# Patient Record
Sex: Female | Born: 1990 | Race: White | Hispanic: No | Marital: Single | Smoking: Never smoker
Health system: Southern US, Community
[De-identification: ages and names within clinical notes are randomized; demographics above are authoritative.]

## PROBLEM LIST (undated history)

## (undated) DIAGNOSIS — R109 Unspecified abdominal pain: Secondary | ICD-10-CM

## (undated) DIAGNOSIS — F430 Acute stress reaction: Secondary | ICD-10-CM

## (undated) DIAGNOSIS — F909 Attention-deficit hyperactivity disorder, unspecified type: Secondary | ICD-10-CM

## (undated) DIAGNOSIS — G4700 Insomnia, unspecified: Secondary | ICD-10-CM

## (undated) DIAGNOSIS — R4681 Obsessive-compulsive behavior: Secondary | ICD-10-CM

## (undated) DIAGNOSIS — K5090 Crohn's disease, unspecified, without complications: Secondary | ICD-10-CM

## (undated) DIAGNOSIS — E282 Polycystic ovarian syndrome: Secondary | ICD-10-CM

## (undated) DIAGNOSIS — F411 Generalized anxiety disorder: Secondary | ICD-10-CM

## (undated) HISTORY — DX: Unspecified abdominal pain: R10.9

## (undated) HISTORY — DX: Generalized anxiety disorder: F41.1

## (undated) HISTORY — DX: Acute stress reaction: F43.0

## (undated) HISTORY — DX: Obsessive-compulsive behavior: R46.81

## (undated) HISTORY — DX: Polycystic ovarian syndrome: E28.2

## (undated) HISTORY — DX: Crohn's disease, unspecified, without complications: K50.90

## (undated) HISTORY — DX: Insomnia, unspecified: G47.00

## (undated) HISTORY — DX: Attention-deficit hyperactivity disorder, unspecified type: F90.9

---

## 2011-10-12 ENCOUNTER — Ambulatory Visit (INDEPENDENT_AMBULATORY_CARE_PROVIDER_SITE_OTHER): Payer: BC Managed Care – PPO | Admitting: Psychiatry

## 2011-10-12 ENCOUNTER — Encounter (HOSPITAL_COMMUNITY): Payer: Self-pay | Admitting: Psychiatry

## 2011-10-12 VITALS — BP 122/79 | HR 79 | Ht 66.0 in | Wt 126.0 lb

## 2011-10-12 DIAGNOSIS — R4681 Obsessive-compulsive behavior: Secondary | ICD-10-CM

## 2011-10-12 DIAGNOSIS — F429 Obsessive-compulsive disorder, unspecified: Secondary | ICD-10-CM

## 2011-10-12 DIAGNOSIS — F900 Attention-deficit hyperactivity disorder, predominantly inattentive type: Secondary | ICD-10-CM | POA: Insufficient documentation

## 2011-10-12 DIAGNOSIS — F419 Anxiety disorder, unspecified: Secondary | ICD-10-CM

## 2011-10-12 DIAGNOSIS — F411 Generalized anxiety disorder: Secondary | ICD-10-CM

## 2011-10-12 DIAGNOSIS — G4700 Insomnia, unspecified: Secondary | ICD-10-CM | POA: Insufficient documentation

## 2011-10-12 DIAGNOSIS — F988 Other specified behavioral and emotional disorders with onset usually occurring in childhood and adolescence: Secondary | ICD-10-CM

## 2011-10-12 MED ORDER — ZOLPIDEM TARTRATE 10 MG PO TABS: 10.0000 mg | ORAL_TABLET | Freq: Every evening | ORAL | Status: AC | PRN

## 2011-10-12 MED ORDER — LISDEXAMFETAMINE DIMESYLATE 20 MG PO CAPS: 20.0000 mg | ORAL_CAPSULE | ORAL | Status: AC

## 2011-10-12 NOTE — Progress Notes (Signed)
Cassandra Daugherty is 20 y.o. 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  She Transferred from [employer].  Her Classes are going well She was taking Adderall ~ 5 months ago. She stopped taking it when she needed to be 'creative' and insurance was inactive when needed Rx.Her parents divorced at age 20 and mother remarried  Her father is a Regulatory affairs officer.   She is an only child  She has step siblings 2 on father's side and 3 on mother's side. Her step fathe Cassandra Daugherty and mother divoced 2 yrs ago when he disclosed he was gay.  He and mother still work at [employer]  Her mother's job changed and pt had to find her own place.  Her BF Cassandra Daugherty [played football] now is a Holiday representative at [employer].  Maj: general studies.  They live together.  She has a part time job at a Recruitment consultant.  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  At night she has pain and racing thoughts.  She smokes cannabis [BF does also - has an immunodeficiency disease] to go to sleep  She is always playing with her hair  And peels nails polish off her nails. She has Chron's Disease, PCOS  She has facial hair that makes her self-conscious   It also causes acne.  She doesn't like her boyfriend to touch her face and she uses lots of makeup.  She has financial worries, goes to school and work afterwards.   She is calm, very articulate.  She denies Suicidal ideation, When she takes Adderall, she may get irritable and at times has to walk out of the classroom to avoid hitting someone for making stupid remarks.  She denies auditory hallucinations, no visual hallucinations.  She is cognitively intact, Her insight is good and Judgement is good.  There is no legal involvement.  She feels like her mother and step father depend upon her good behavior because they have so much care to give to her step brother, Cassandra Daugherty, who has schizophrenia; has ADHD.  He is very intelligent top 1% [place].   Her mother and step father spend so much attention to her care that she feels like they take her for granted.  She says she has a pattern of having everything right, has to be 'just so'.  She says her mother has the same behaviors.  She describes this as her anxiety, especially when going to sleep She would like to resume taking her adderall - later stated as Vyvanse.   She agrees also to a trial period of taking ONLY  Zolpidem for sleep to test its efficacy.   She is receptive to the idea of therapy to gain insight and  control her obsessive thinking.  She does not describe any compulsive behaviors and does not report disruption of a responsible routine.

## 2011-10-12 NOTE — Patient Instructions (Addendum)
Take Rxs as directed.  Do not use amitriptyline with ambien. [zolpidem]  Call with any adverse effects.  Crisis hot line card is given Make appt  With KL Return in 1 month.

## 2011-10-15 ENCOUNTER — Telehealth (HOSPITAL_COMMUNITY): Payer: Self-pay

## 2011-10-15 NOTE — Telephone Encounter (Signed)
Pharmacy requests Prior Authorization for Vyvanse and for zolpidem

## 2011-10-15 NOTE — Telephone Encounter (Signed)
[facility] [phone]  says they need verification that you wrote the scripts please call pharmacy

## 2011-10-15 NOTE — Telephone Encounter (Signed)
Called [facility] to verify PA is neded for Vyvanse and for zolpidem.  Called [facility] and provided Id and pt data.  PA for Vyvanse is given Sep 23, 2011 --Oct 14, 2012  # [id number]  And PA for zopidem 10 mg  Sep 24, 2011 - Oct 14, 2012  #[id number]

## 2011-11-06 ENCOUNTER — Ambulatory Visit (INDEPENDENT_AMBULATORY_CARE_PROVIDER_SITE_OTHER): Payer: BC Managed Care – PPO | Admitting: Psychology

## 2011-11-06 ENCOUNTER — Encounter (HOSPITAL_COMMUNITY): Payer: Self-pay | Admitting: Psychology

## 2011-11-06 DIAGNOSIS — R4681 Obsessive-compulsive behavior: Secondary | ICD-10-CM

## 2011-11-06 DIAGNOSIS — F429 Obsessive-compulsive disorder, unspecified: Secondary | ICD-10-CM

## 2011-11-06 NOTE — Progress Notes (Signed)
Presenting Problem Chief Complaint: Too much on my plate at once; I got to school all day and go to work straight at night.  She feels like she's progressing but then doesn't feel that way. 'I don't have any time for myself'.  What are the main stressors in your life right now? Depression  1, Anxiety   3, Mood Swings  2, Sleep Changes   3, Work Problems   2, Racing Thoughts   3, Confusion   1, Memory Problems   2, Loss of Interest   3, Irritability   2, Excessive Worrying   2, Low Energy   1, Obsessive Thoughts   3, Checking   2, Poor Concentration   3 and Hyperactivity   1  How long have you had these symptoms?: Mother and stepfather who was real father figure divorced three years ago after he disclosed that he was gay.   Previous mental health services Have you ever been treated for a mental health problem? Yes  If Yes, when? 11th grade (about six months and patient didn't really notice a change , where? [place], by whom? Doesn't recall name of provider   Are you currently seeing a therapist or counselor? No  Have you ever had a mental health hospitalization? No  Have you ever been treated with medication for a mental health problem? Yes If Yes, please list as completely as possible (name of medication, reason prescribed, and response: Prozac, and various ADHD meds  Have you ever had suicidal thoughts or attempted suicide? No  Risk factors for Suicide Demographic factors:  Adolescent or young adult, Caucasian and Low socioeconomic status Current mental status:  Loss factors: Loss of significant relationship and Financial problems/change in socioeconomic status- had to move away from mom when she got a ob [facility] after divorcing her husband; her mother decided the patient needed to do things on her own and her mother moved to [place] Historical factors: Family history of mental illness or substance abuse Risk Reduction factors: Employed, Living with another person,  especially a relative, Positive social support and Positive coping skills or problem solving skills Clinical factors:  Depression:   Anhedonia Insomnia Recent sense of peace/wellbeing Cognitive features that contribute to risk:  SUICIDE RISK:  Minimal: No identifiable suicidal ideation.  Patients presenting with no risk factors but with morbid ruminations; may be classified as minimal risk based on the severity of the depressive symptoms   Medical history Medical treatment and/or problems: Yes If Yes, please explain; Crohn's disease, PCOD  Name of primary care physician/last physical exam: Linn Doyne Keel with [facility] in [place]; Dr. Tresa Endo at [facility]  Chronic pain issues: Yes If Yes, please explain: stomach and knees (sports activities)  Allergies: No   Current medications: See medication history  Is there any history of mental health problems or substance abuse in your family? Yes If Yes, please explain (include information on parents, siblings, aunts/uncles, grandparents, cousins, etc.): Mom has depression and has been on Prozac and the patient has increased concern for alcoholism.  Her dad had a history of depression and has been on Prozac.  Her maternal grandparents and her paternal grandfather were alcoholic.  Has anyone in your family been hospitalized for mental health problems? No   Social/family history Who lives in your current household? She lives in [place] in an apartment with her 90 year old boyfriend [place] and her cat Georgeann Oppenheim, and her puppy Thamas Jaegers.  She feels stressed by the size of the apartment (2  br but 2nd bedroom is laundry room) as well and is obsessive in her need to keep the apartment clean.  Military history: Have you ever been in the [employer]? No  Religious/spiritual involvement:  What Religion are you? Agnostic  Family of origin (childhood history)  Where were you born? [facility] Where did you grow up?  [place] until 6; [place] adolescence until age 60. Describe the household where you grew up: With her father and mother; her father would be lazy and her grandmother would take care of her because he didn't want to take care of her.  Her stepfather was always there for her and always attended her events when he wasn't working. Do you have siblings, step/half siblings? Yes If Yes, please list names, sex and ages: two stepbrothers and one stepsister with stepfather's; her stepmother has two daughters.  She got along much better with her stepfather's children than her stemother's children.  She rpeorts her stepmother's children were very entitled, made good grades and always excelled in sports and she felt she had to live up to those expectations.  The patient is the youngest of all of the children.  Are your parents separated/divorced? Yes If Yes, approximately when? Patient was 5 years old  Are you presently: living with boyfriend of 11 months.  Social supports (personal and professional): her mom  Education How many grades have you completed? some college Do you hold any Degrees? Yes If Yes, in what?  will graduate with a degree in May 2013- graphics arts and Youth worker  From where? [facility] What were your special talents/interests in school? Art, very creative  Did you have any problems in school? Yes If Yes, were these problems behavioral, attention, or due to learning difficulties? Academic- had trouble paying attention Were any medications ever prescribed for these problems? Yes If Yes, what were the medications? Adderall- age 77 and has made straight A's since   Employment (financial issues) Do you work? 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 About a year  Name of employer: [employer] Do you enjoy your present job? 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 Yes If Yes, please explain: Dealing with too much at once and your money depending on your success.   Legal history Do you have any current legal issues?No   Do you have any past legal issues?None  Trauma/Abuse history: Have you ever been exposed to any form of abuse? No  Have you ever been exposed to something traumatic? Yes If yes, please described: close friend shot himself when they were seniors- she was the last person that she spoke to before he killed himself- he stole friend's father's gun- there were rumors that she and female friend Ladona Ridgel had given him the gun and his parent's tires were slashed at the funeral; her stepbrother was a Consulting civil engineer at [employer] during the shooting rampage and she couldn't get a hold of him; her parents getting divorced.  She denies nightmares.  She will have flashbacks when she visits Ladona Ridgel and has only been there twice in the past year.  Her boyfriend is friends with him and that's the only reason she goes there.  Substance use Do you use Caffeine? Yes If Yes, what type? 20 oz coffee How often? daily  Do you use Nicotine? No  Do you use Alcohol? Yes If Yes, what type? wine Frequency? Three times per week two glasses of wine  At what age did you take your first drink? 13 Was this accepted by your family? Yes  When was your last drink? SUnday How much? Glass of wine  Have you ever experienced any form of withdrawal symptoms, i.e., Hallucinations, Tremors, Excessive Sweating, or Nausea or Vomiting? No  Have you ever experienced blackouts? No  Have you ever had a DWI/DUI? No  Do you have any legal charges pending involving substance abuse? No  Have you ever used illicit drugs or taken more than prescribed? Yes If Yes, what type? Cannabis  Frequency: daily until three weeks ago when started Ambien   Date of last usage: three weeks ago  Have you ever experienced any  withdrawal symptoms as listed above? No  If you are not using presently, have you ever used in the past? No  Have you ever received treatment for Alcohol or Substance Abuse problems? No   Mental Status: General Appearance Cassandra Daugherty:  Neat Eye Contact:  Good Motor Behavior:  Normal Speech:  Normal Level of Consciousness:  Alert Mood:  Euthymic Affect:  Appropriate Anxiety Level:  None Thought Process:  Coherent and Relevant Thought Content:  WNL Perception:  Normal Judgment:  Good Insight:  Present Cognition:  Orientation time, place and person Sleep: insomnia  Diagnosis AXIS I ADHD, inattentive type and Anxiety Disorder NOS  AXIS II No diagnosis  AXIS III Past Medical History  Diagnosis Date  . ADHD (attention deficit hyperactivity disorder)   . Anxiety in acute stress reaction     delays sleep  . Stomach pain     endoscopy performed; entiology-presumed Crohn's Ds  . PCOS (polycystic ovarian syndrome)     HIRSUITISM -unresponsive to aldosterone; metformin-diarrhea  . Crohn's disease     in remission today  . Insomnia     thinking too much inability to relax  . Obsessive behaviors     cannot relax    AXIS IV economic problems, occupational problems and distant relationship from father  AXIS V 51-60 moderate symptoms    Plan: Meet again in two weeks at the client's request.   __________________________________________ Signature/Date

## 2011-11-06 NOTE — Patient Instructions (Addendum)
1- Deal with her feelings as they occur and do not stuff them. 2- Evaluate your thoughts and when you discover yourself thinking about destructive thoughts you're going to stop yourself and think about something positive and/or put in place a distracting activity. 3. Develop rules with boyfriend for arguing.

## 2011-11-13 ENCOUNTER — Encounter (HOSPITAL_COMMUNITY): Payer: Self-pay | Admitting: Psychiatry

## 2011-11-13 ENCOUNTER — Ambulatory Visit (INDEPENDENT_AMBULATORY_CARE_PROVIDER_SITE_OTHER): Payer: BC Managed Care – PPO | Admitting: Psychiatry

## 2011-11-13 DIAGNOSIS — G4700 Insomnia, unspecified: Secondary | ICD-10-CM

## 2011-11-13 DIAGNOSIS — F988 Other specified behavioral and emotional disorders with onset usually occurring in childhood and adolescence: Secondary | ICD-10-CM

## 2011-11-13 DIAGNOSIS — F329 Major depressive disorder, single episode, unspecified: Secondary | ICD-10-CM

## 2011-11-13 DIAGNOSIS — F900 Attention-deficit hyperactivity disorder, predominantly inattentive type: Secondary | ICD-10-CM

## 2011-11-13 MED ORDER — BUPROPION HCL ER (SR) 100 MG PO TB12: 100.0000 mg | ORAL_TABLET | Freq: Two times a day (BID) | ORAL | Status: DC

## 2011-11-13 MED ORDER — LISDEXAMFETAMINE DIMESYLATE 20 MG PO CAPS: 20.0000 mg | ORAL_CAPSULE | ORAL | Status: DC

## 2011-11-13 MED ORDER — ZOLPIDEM TARTRATE 10 MG PO TABS: 10.0000 mg | ORAL_TABLET | Freq: Every evening | ORAL | Status: DC | PRN

## 2011-11-13 NOTE — Patient Instructions (Signed)
Continue Vyvanse and ambien.  Start Wellbutrin as 1 per day and increase to 2 X day if tolerated. Remember to cal office or Crisis numbers if you have suicidal thoughts.   Return in 1 month.    Meet with [facility]

## 2011-11-14 ENCOUNTER — Encounter (HOSPITAL_COMMUNITY): Payer: Self-pay | Admitting: Psychiatry

## 2011-11-14 NOTE — Progress Notes (Signed)
   [facility] Behavioral Health Follow-up Outpatient Visit  Cassandra Daugherty [birth year]  Date: 11/13/11  Subjective: Medication has been helpful.   "I am facing a lot of deadlines tonight and will be up late".  Filed Vitals:   11/13/11 1507  BP: 119/77  Pulse: 73    Mental Status Examination  Appearance: very neat, casual clothes, dark red hair Alert: Yes Attention: good  Cooperative: Yes Eye Contact: Good Speech: noral rate and tone Psychomotor Activity: Normal Memory/Concentration: improved Oriented: person, place, time/date and situation Mood: Euthymic,but more irritable at end of semester;  concern about getting assignments completed Affect: Congruent Thought Processes and Associations: Logical Fund of Knowledge: Good Thought Content: organized, sequential Insight: Fair Judgement: Good  Diagnosis: ADD, DEPRESSION NOS  Treatment PlanTAKE MEDICATION, ADD WELLBUTRIN, RETURN FOR EVALUATION & MED. MANAGEMENT.  CONTINUE SEEING Briscoe Deutscher, MD

## 2011-11-20 ENCOUNTER — Encounter (HOSPITAL_COMMUNITY): Payer: Self-pay | Admitting: Psychology

## 2011-11-20 ENCOUNTER — Ambulatory Visit (INDEPENDENT_AMBULATORY_CARE_PROVIDER_SITE_OTHER): Payer: BC Managed Care – PPO | Admitting: Psychology

## 2011-11-20 DIAGNOSIS — F411 Generalized anxiety disorder: Secondary | ICD-10-CM | POA: Insufficient documentation

## 2011-11-20 NOTE — Progress Notes (Signed)
   THERAPIST PROGRESS NOTE  Session Time: 105-205 pm  Participation Level: Active  Behavioral Response: Well GroomedAlertEuthymic  Type of Therapy: Individual Therapy  Treatment Goals addressed: Anxiety and Coping  Interventions: Solution Focused, Strength-based and Supportive  Summary: Cassandra Daugherty is a 20 y.o. female who presents with anxiety.  She is excited to shared that she just found out she achieved straight A's this semester of college and that she only has one more semester to graduation.  She is pleased with the progress she has made since her first visit and reports doing the assigned homework successfully.  She talked with her boyfriend about her needs regarding their communication and he was receptive to engaging her in a way that was comfortable to her.  This therapist and the client completed the development of her treatment plan.  Suicidal/Homicidal: No  Plan: Return after January 11th at the patient's request.  Diagnosis: Axis I: Generalized Anxiety Disorder    Axis II: No diagnosis    Salley Scarlet, [facility] 11/20/2011

## 2011-11-20 NOTE — Patient Instructions (Signed)
1- Continue practicing negative thought blocking and positive thought insertion. 2- Practice expressing feelings verbally. 3- Write down five physical attributes that you appreciate about you.

## 2011-12-11 ENCOUNTER — Ambulatory Visit (INDEPENDENT_AMBULATORY_CARE_PROVIDER_SITE_OTHER): Payer: BC Managed Care – PPO | Admitting: Psychiatry

## 2011-12-11 ENCOUNTER — Encounter (HOSPITAL_COMMUNITY): Payer: Self-pay | Admitting: Psychiatry

## 2011-12-11 DIAGNOSIS — F988 Other specified behavioral and emotional disorders with onset usually occurring in childhood and adolescence: Secondary | ICD-10-CM

## 2011-12-11 DIAGNOSIS — F900 Attention-deficit hyperactivity disorder, predominantly inattentive type: Secondary | ICD-10-CM

## 2011-12-11 DIAGNOSIS — F32A Depression, unspecified: Secondary | ICD-10-CM

## 2011-12-11 DIAGNOSIS — G4700 Insomnia, unspecified: Secondary | ICD-10-CM

## 2011-12-11 DIAGNOSIS — F329 Major depressive disorder, single episode, unspecified: Secondary | ICD-10-CM

## 2011-12-11 DIAGNOSIS — F429 Obsessive-compulsive disorder, unspecified: Secondary | ICD-10-CM

## 2011-12-11 MED ORDER — LISDEXAMFETAMINE DIMESYLATE 20 MG PO CAPS: 20.0000 mg | ORAL_CAPSULE | ORAL | Status: AC

## 2011-12-11 MED ORDER — ZOLPIDEM TARTRATE 10 MG PO TABS: 10.0000 mg | ORAL_TABLET | Freq: Every evening | ORAL | Status: AC | PRN

## 2011-12-11 MED ORDER — BUPROPION HCL ER (SR) 100 MG PO TB12: 100.0000 mg | ORAL_TABLET | Freq: Two times a day (BID) | ORAL | Status: DC

## 2011-12-11 NOTE — Patient Instructions (Signed)
Today you have been given your prescriptions for Wellbutrin Vyvance and Ambien. U. deny any suicidal thoughts but remember to call 911 or the behavioral Health Center in [place] [phone]. This wishes and I will for now if I for you move to a new apartment and beginning a new semester at school. Please return in one month

## 2011-12-11 NOTE — Progress Notes (Signed)
Patient ID: Cassandra Daugherty, female   DOB: 02-09-1991, 21 y.o.   MRN: [id number] Cassandra Daugherty states that she has been doing fairly well. The medication has made her feel better and she asked how it works. A brief explanation of the neurotransmitter system and the effect it has on the limbic system as described. She states she is also sleeping better. Therapy with Cassandra Daugherty has been useful because it has created new insight for her. She is getting along well with her boyfriend and they plan to move to a better apartment soon. She is dealing with some serious family problems. It appears that her brother has been taking prescriptions from the family business (printing ), writing prescriptions and selling medications. He's also been using the family printing credit card to purchase these medications. He has been identified and has been asked to turn himself in rather than be arrested. He has not done so. Because the printing credit card has been used he has also implicated his parents. She expects he is facing a 25 year sentence and he is only 22 years old. Her other brother is in [place] and happens to be a Emergency planning/management officer in the narcotic squad. She is very distressed about this revelation and concerned about her appearance as well as her brother. She reports that the Vyvance has really helped her in combination with the Wellbutrin she is maintaining a positive mood and has been able to be more effective in her daily tasks. She denies any suicidal thoughts. And has been sleeping better. She agrees to return in one month for her Vyvance prescription.

## 2011-12-14 ENCOUNTER — Ambulatory Visit (HOSPITAL_COMMUNITY): Payer: Self-pay | Admitting: Psychiatry

## 2011-12-18 ENCOUNTER — Ambulatory Visit (INDEPENDENT_AMBULATORY_CARE_PROVIDER_SITE_OTHER): Payer: BC Managed Care – PPO | Admitting: Psychology

## 2011-12-18 DIAGNOSIS — F411 Generalized anxiety disorder: Secondary | ICD-10-CM

## 2011-12-18 DIAGNOSIS — F900 Attention-deficit hyperactivity disorder, predominantly inattentive type: Secondary | ICD-10-CM

## 2011-12-18 DIAGNOSIS — F988 Other specified behavioral and emotional disorders with onset usually occurring in childhood and adolescence: Secondary | ICD-10-CM

## 2011-12-18 NOTE — Patient Instructions (Signed)
1-Spend increased time reviewing math to increase confidence. 2-Pay close attention to what you're thinking and when you think negative thoughts stop yourself and insert positive ones. 3-Write down five positive attributes you possess. 4-Deal with your feelings as they occur and don't stuff them.

## 2011-12-18 NOTE — Progress Notes (Signed)
   THERAPIST PROGRESS NOTE  Session Time: 902- 1000 am  Participation Level: Active  Behavioral Response: Well GroomedAlertEuthymic  Type of Therapy: Individual Therapy  Treatment Goals addressed: Anxiety and Coping  Interventions: CBT, Solution Focused, Strength-based, Psychosocial Skills: coping and Supportive  Summary: Cassandra Daugherty is a 21 y.o. female who presents with as pleasant and easily engaged.  She is tired but excited to be moving into a larger apartment that is brand new.  She feels good about this move because she has felt too confined in her current place.  Her mother and her boyfriend's mother are helping the young couple to get settled financially in this new apartment.  Her boyfriend will be pitching in more money to make the move a success since it is $300 more per month.  She had a stressful holiday due to events that unfolded in her family. The patient shared details of her step-brother Mat Carne (whom she sees as a brother and her close friend) was caught forging prescriptions for pain pills.  He reportedly was working part time for the patient's mother and stepfather in their office supply company and was stealing prescription pads and writing prescriptions then using and selling.  The patient talked at length how this has effected her and her family.  Her mother has finally been able to move a little forward and has been spending some positive energy on the patient and she likes this.  The patient has been dealing with the feelings of betrayal from her brother's behavior since he has been using her mother's company credit card and her medical insurance to pay for the medications.  The patient has not talked to him and now sees him differently and doesn't know that she would visit him in prison where he will likely be going.  Suicidal/Homicidal: No  Plan: Return again in 2 weeks.  Diagnosis: Axis I: Generalized Anxiety Disorder, ADHD    Axis II: No  diagnosis    Salley Scarlet, [facility] 12/18/2011

## 2011-12-19 ENCOUNTER — Encounter (HOSPITAL_COMMUNITY): Payer: Self-pay | Admitting: Psychology

## 2012-01-04 ENCOUNTER — Ambulatory Visit (HOSPITAL_COMMUNITY): Payer: Self-pay | Admitting: Psychology

## 2012-01-08 ENCOUNTER — Ambulatory Visit (HOSPITAL_COMMUNITY): Payer: Self-pay | Admitting: Psychology

## 2012-01-11 ENCOUNTER — Ambulatory Visit (INDEPENDENT_AMBULATORY_CARE_PROVIDER_SITE_OTHER): Payer: BC Managed Care – PPO | Admitting: Psychiatry

## 2012-01-11 ENCOUNTER — Encounter (HOSPITAL_COMMUNITY): Payer: Self-pay | Admitting: Psychiatry

## 2012-01-11 VITALS — BP 118/71 | HR 79 | Ht 66.0 in | Wt 162.0 lb

## 2012-01-11 DIAGNOSIS — F429 Obsessive-compulsive disorder, unspecified: Secondary | ICD-10-CM

## 2012-01-11 DIAGNOSIS — F909 Attention-deficit hyperactivity disorder, unspecified type: Secondary | ICD-10-CM

## 2012-01-11 DIAGNOSIS — F329 Major depressive disorder, single episode, unspecified: Secondary | ICD-10-CM

## 2012-01-11 DIAGNOSIS — F900 Attention-deficit hyperactivity disorder, predominantly inattentive type: Secondary | ICD-10-CM

## 2012-01-11 DIAGNOSIS — G4700 Insomnia, unspecified: Secondary | ICD-10-CM

## 2012-01-11 MED ORDER — ZOLPIDEM TARTRATE 10 MG PO TABS: 10.0000 mg | ORAL_TABLET | Freq: Every evening | ORAL | Status: AC | PRN

## 2012-01-11 MED ORDER — LISDEXAMFETAMINE DIMESYLATE 20 MG PO CAPS: 20.0000 mg | ORAL_CAPSULE | ORAL | Status: AC

## 2012-01-11 MED ORDER — BUPROPION HCL ER (SR) 100 MG PO TB12: 100.0000 mg | ORAL_TABLET | Freq: Two times a day (BID) | ORAL | Status: DC

## 2012-01-11 NOTE — Progress Notes (Signed)
Patient ID: Cassandra Daugherty, female   DOB: 1991-04-04, 21 y.o.   MRN: [id number] Cassandra Daugherty is still going to college and has only one math class to complete. She states that she and her boyfriend have moved into a larger apartment and finds that that is so satisfying to be in a more spacious place. She states that it has really eased the conflicts they've been having. She reports that her brother who had forfeited prescriptions from his appearance and is now being pursued by the police is in [place] attending a rehabilitation program. The police are still looking for him and he understands that he has to report them when he gets out of his rehabilitation. Cassandra Daugherty has been struggling with the stress and distress this is placed on her parents. She continues to work and is feeling quite well at this time. She has an appointment to see JB. She is informed about the new psychiatrist who will be

## 2012-01-11 NOTE — Patient Instructions (Addendum)
You have been given Wellbutrin to take 2 pills at night and add to that unit therapy but she went to change to maybe once a month with KL.  You have also been given your Vyvance and Ambien prescriptions. You have had is stress with family issues and they were encouraged to call 911, ADD urgency department or [facility] [phone]. Please make your appointment for April and meet the new psychiatrist Dr. Demetrius Charity.

## 2012-01-25 ENCOUNTER — Ambulatory Visit (INDEPENDENT_AMBULATORY_CARE_PROVIDER_SITE_OTHER): Payer: BC Managed Care – PPO | Admitting: Psychology

## 2012-01-25 ENCOUNTER — Encounter (HOSPITAL_COMMUNITY): Payer: Self-pay | Admitting: Psychology

## 2012-01-25 DIAGNOSIS — F909 Attention-deficit hyperactivity disorder, unspecified type: Secondary | ICD-10-CM

## 2012-01-25 DIAGNOSIS — F429 Obsessive-compulsive disorder, unspecified: Secondary | ICD-10-CM

## 2012-01-25 DIAGNOSIS — F900 Attention-deficit hyperactivity disorder, predominantly inattentive type: Secondary | ICD-10-CM

## 2012-01-25 DIAGNOSIS — F411 Generalized anxiety disorder: Secondary | ICD-10-CM

## 2012-01-25 NOTE — Patient Instructions (Signed)
1-Evaluate the purpose of monitoring you boyfriend's ex-girlfriend. 2-Talk with boyfriend about some boundaries around task completion at home. 3-Make decisions based not only on feeling but evaluate the facts and the big picture. 4-Depersonalize.  Don't let people define who you are.

## 2012-01-25 NOTE — Progress Notes (Signed)
   THERAPIST PROGRESS NOTE  Session Time: 1100 am-1200 pm  Participation Level: Active  Behavioral Response: Well GroomedAlertEuthymic  Type of Therapy: Individual Therapy  Treatment Goals addressed: Anger, Communication: her thoughts and feelings and Coping  Interventions: Solution Focused, Strength-based, Psychosocial Skills: communicating thoughts and feelings and Supportive  Summary: Cassandra Daugherty is a 21 y.o. female who presents on time for her appointment.  She is pleasant and easily engaged.  She thinks things are going well.  Her brother is in drug treatment in [facility] and will be returning to be arrested for stealing and forging prescriptions from her parent's business.  She is getting the needed help in her last class (math) prior to her graduation this summer.  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  The patient is being given a trip to [facility] for she and her boyfriend following graduation (from her mother). The patient is excited about this and will be traveling with her mother/mother's boyfriend.  She is excited about a potential job opportunity within walking distance to her home but is conflicted if she wishes to leave her current job (of three years) where she has good relationships with management and is already secured a position at their new location not far from her home.  She decided during session that keeping her current job is probably best for her since she has such a good relationship with her employers.  The patient spent time in the session talking about some minor communication issues with her boyfriend that result in major issues because of her reactions.  I suggested she talk with her boyfriend and negotiate some of their issues that need addressed; she admits a lot has to do with her OCD.  She then brought up her obsession with his ex-girlfriend.  Her perspective is that this girl still wants her boyfriend and as a result she tracks her online and reads  into any remarks she makes.  She denies her boyfriend ever giving her a reason for concern and I suggested that if this was the case that she needed to consider how staying linked to this girl was destructive for her because if kept her preoccupied and interfered in her relationship with her boyfriend.  She is agreeable that this needs to change and decided to remove her from her social networking sites.  Suicidal/Homicidal: No  Plan: Return again in 4 weeks.  Diagnosis: Axis I: ADHD, inattentive type, Generalized Anxiety Disorder and Obsessive Compulsive Disorder    Axis II: No diagnosis    Salley Scarlet, [facility] 01/25/2012

## 2012-02-22 ENCOUNTER — Encounter (HOSPITAL_COMMUNITY): Payer: Self-pay | Admitting: Psychiatry

## 2012-02-22 ENCOUNTER — Ambulatory Visit (HOSPITAL_COMMUNITY): Payer: Self-pay | Admitting: Psychology

## 2012-02-22 ENCOUNTER — Ambulatory Visit (INDEPENDENT_AMBULATORY_CARE_PROVIDER_SITE_OTHER): Payer: BC Managed Care – PPO | Admitting: Psychiatry

## 2012-02-22 VITALS — BP 130/79 | HR 74 | Ht 66.0 in | Wt 164.0 lb

## 2012-02-22 DIAGNOSIS — F900 Attention-deficit hyperactivity disorder, predominantly inattentive type: Secondary | ICD-10-CM

## 2012-02-22 DIAGNOSIS — F329 Major depressive disorder, single episode, unspecified: Secondary | ICD-10-CM

## 2012-02-22 DIAGNOSIS — F909 Attention-deficit hyperactivity disorder, unspecified type: Secondary | ICD-10-CM

## 2012-02-22 MED ORDER — BUPROPION HCL ER (SR) 100 MG PO TB12: 100.0000 mg | ORAL_TABLET | Freq: Two times a day (BID) | ORAL | Status: AC

## 2012-02-22 NOTE — Progress Notes (Signed)
Psychiatric Assessment Adult  Patient Identification:  Cassandra Daugherty Date of Evaluation:  02/22/2012 Chief Complaint: I originally went to another psychiatric.   HPI   Patient is a 21 y/o female with a past psychiatric history significant forAttention Deficit Hyperactivity Disorder, inattentive type, Generalized Anxiety Disorder, and Obsessive Compulsive Disorder.  The patient had been on stimulant medication since 10th grade.  She was getting B's C's and with stimulant medication went to [employer]. She does however report that her attention varied depending on the subject, but typically did poorly in math.  She had a trial of Prozac for a stay. She report one or two episodes of palpitations in the past. She reports that she only uses while studying math and prior to math tests, (up to 9 days a month).  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  She reports that she has enough Vyvanse at home, keeps the medication safely, and does not wish to continue use of this medication after graduation. She reports that she has been having trouble sleeping at night, particularly on days that she takes Vyvanse, but also reports that she has been taking Bupropion all at bedtime.  To this provider, she reports that her most significant stressor is obtaining her degree and getting a job.     Review of Systems  Respiratory: Negative for shortness of breath.   Cardiovascular: Positive for palpitations. Negative for chest pain and leg swelling.    Physical Exam  Depressive Symptoms: None  (Hypo) Manic Symptoms:   Elevated Mood:  No Irritable Mood:  No Grandiosity:  No Distractibility:  No Lability of Mood:  No Delusions:  No Hallucinations:  No Impulsivity:  No Sexually Inappropriate Behavior:  No Financial Extravagance:  No Flight of Ideas:  No  Anxiety Symptoms: Excessive Worry:  Yes Panic Symptoms:  No Agoraphobia:   No Obsessive Compulsive: Yes  Symptoms: Checking, Specific Phobias:  Yes Closed spaces (i.e.., Elevators) Social Anxiety:  No  Psychotic Symptoms:  Hallucinations: No None Delusions:  No Paranoia:  No   Ideas of Reference:  No  PTSD Symptoms: Ever had a traumatic exposure:  No Had a traumatic exposure in the last month:  No Re-experiencing: No None Hypervigilance:  No Hyperarousal: No None Avoidance: No None  Traumatic Brain Injury: No   Past Psychiatric History: Diagnosis: Attention Deficit Hyperactivity Disorder  Hospitalizations: Patient denies.  Outpatient Care: Patient started at 10th grade.  Substance Abuse Care: Patient denies.  Self-Mutilation: Patient denies.  Suicidal Attempts: Patient denies.  Violent Behaviors: Patient denies.   Past Medical History:     . PCOS (polycystic ovarian syndrome)     HIRSUTISM -unresponsive to aldosterone; metformin-diarrhea  . Crohn's disease     PCP: [facility]. [facility].  History of Loss of Consciousness:  No Seizure History:  No Cardiac History:  No Allergies:  No Known Allergies Current Medications:  Current Outpatient Prescriptions  Medication Sig Dispense Refill  . buPROPion (WELLBUTRIN SR) 100 MG 12 hr tablet Take 1 tablet (100 mg total) by mouth 2 (two) times daily.  60 tablet  2  . metFORMIN (GLUCOPHAGE) 500 MG tablet Take 500 mg by mouth daily. Rx causes diarrhea   Pt takes it only PRN for PCOS but aggravates Chron's SDs.       Marland Kitchen norethindrone-ethinyl estradiol (JUNEL FE,GILDESS FE,LOESTRIN FE) 1-20 MG-MCG tablet Take 1 tablet by mouth daily.  Vyvanse 20 mg- patient uses this upton 9 times per month. Zolpidem 10 mg -patient uses this upton 9 times per month.  Previous Psychotropic Medications:  Medication Dose  Adderall-  unknown  Prozac- 6 months --inadequate trial-Prozac  unknown  Wellbutrin-4 months. 100 mg-two tablets at bedtime   Substance Abuse History in the last 12  months: Patient denies nicotine use. Patient denies alcohol abuse. Patient report episodic marijuana use.    Social History: Current Place of Residence: [place] Place of Birth: [place], [place] Family Members: Mother, father, 2 step brother, one step-sister Marital Status:  Married Children: none   Relationships: Patient reports that her mother is her greatest source of emotional support. Education:  [employer] Problems/Performance:Good. Religious Beliefs/Practices:Has done. History of Abuse: none Occupational Experiences; Military History:  None. Legal History: Patient denies Hobbies/Interests: Exercising, Art Projects.  Family History:   Family History  Problem Relation Age of Onset  . Depression Mother   . Depression Father   . Alcohol abuse Maternal Grandfather   . Alcohol abuse Maternal Grandmother   . Alcohol abuse Paternal Grandfather     Mental Status Examination/Evaluation: Objective:  Appearance: Casual and Well Groomed  Eye Contact::  Good  Speech:  Clear and Coherent and Normal Rate  Volume:  Normal  Mood:  "okay:  Affect:  Appropriate and Full Range  Thought Process:  Coherent, Irrelevant, Linear and Logical  Orientation:  Full  Thought Content:  WDL  Suicidal Thoughts:  No  Homicidal Thoughts:  No  Judgement:  Good  Insight:  Good  Psychomotor Activity:  Normal  Akathisia:  No  Handed:  Right  Assets:  Communication Skills Desire for Improvement Financial Resources/Insurance Housing Physical Health Social Support Vocational/Educational     Assessment:    AXIS I ADHD, inattentive type, Generalized Anxiety Disorder and Obsessive Compulsive Disorder rule out Mathematics Disorder.  AXIS II Rule out Cluster C traits  AXIS III Crohn's Disease PCOS   AXIS IV limited primary support  AXIS V 51-60 moderate symptoms   Treatment Plan/Recommendations:  PLAN:  1. Affirm with the patient that the medications are taken as  ordered. Patient  expressed understanding of how their medications were to be used.  2. Continue the following psychiatric medications as written prior to this appointment with the following changes:  a) Continue Vyvanse 20 mg. The patient has Vyvanse at home and reports she has enough to last until graduation. She is not sure whether she will use this after graduation as she reports insomnia with the medication. She also states she currently uses Vyvanse to study for and take math test which brings the question for whether this is a Mathematics Disorder or that she has some resolution of her ADHD symptoms. b) Continue Wellbutrin SR 100 mg BID-switch to QAM and Q3PM. c) Continue Zolpidem 10 mg QHS  PRN, patient has enough medication to last until graduation. d) Will ascertain if Wellbutrin alone will suffice for this patient's symptoms over the next few months after graduation. e) Advised patient about complete cessation of marijuana use. 3. Therapy: brief supportive therapy provided. Continue current services. Continue individual therapy. 4. Risks and benefits, side effects and alternatives discussed with patient, she was given an opportunity to ask questions about her medication, illness, and treatment. All current psychiatric medications have been reviewed and discussed with the patient and adjusted as clinically appropriate. The patient has been provided an accurate and updated list of the medications being now prescribed.  5. Patient told to call  clinic if any problems occur. Patient advised to go to ER  if she should develop SI/HI, side effects, or if symptoms worsen. Has crisis numbers to call if needed.   6. No labs warranted at this time. Will order random urine drug screens for this patient if she continues to require Vyvanse. 7. The patient was encouraged to keep all PCP and specialty clinic appointments.  8. Patient was instructed to return to clinic in 1 month.  9. The patient expressed  understanding of the plan and agrees with the above.      Jacqulyn Cane, MD 3/22/20132:02 PM

## 2012-03-07 ENCOUNTER — Ambulatory Visit (HOSPITAL_COMMUNITY): Payer: Self-pay | Admitting: Psychiatry

## 2012-03-14 ENCOUNTER — Ambulatory Visit (HOSPITAL_COMMUNITY): Payer: Self-pay | Admitting: Psychology

## 2012-03-28 ENCOUNTER — Ambulatory Visit (HOSPITAL_COMMUNITY): Payer: Self-pay | Admitting: Psychiatry

## 2020-08-15 ENCOUNTER — Other Ambulatory Visit: Payer: Self-pay

## 2020-08-15 ENCOUNTER — Emergency Department (INDEPENDENT_AMBULATORY_CARE_PROVIDER_SITE_OTHER)
Admission: EM | Admit: 2020-08-15 | Discharge: 2020-08-15 | Disposition: A | Discharge status: Home / Self Care | Payer: BC Managed Care – PPO | Source: Home / Self Care

## 2020-08-15 ENCOUNTER — Emergency Department (INDEPENDENT_AMBULATORY_CARE_PROVIDER_SITE_OTHER): Payer: BC Managed Care – PPO

## 2020-08-15 DIAGNOSIS — M542 Cervicalgia: Secondary | ICD-10-CM

## 2020-08-15 DIAGNOSIS — M549 Dorsalgia, unspecified: Secondary | ICD-10-CM

## 2020-08-15 DIAGNOSIS — W11XXXA Fall on and from ladder, initial encounter: Secondary | ICD-10-CM

## 2020-08-15 DIAGNOSIS — M546 Pain in thoracic spine: Secondary | ICD-10-CM

## 2020-08-15 MED ORDER — CYCLOBENZAPRINE HCL 5 MG PO TABS: 5.0000 mg | ORAL_TABLET | Freq: Two times a day (BID) | ORAL | 0 refills | Status: AC | PRN

## 2020-08-15 NOTE — ED Provider Notes (Signed)
[facility]    CSN: [id number] Arrival date & time: 08/15/20  1019      History   Chief Complaint Chief Complaint  Patient presents with  . Fall  . Back Pain    HPI Cassandra Daugherty is a 29 y.o. female.   HPI  Cassandra Daugherty is a 29 y.o. female presenting to UC with c/o Right upper middle back pain and shoulder pain since falling off a ladder in her yard.  She believes she was about 6 feet up. Denies hitting her head or LOC.  Pain is aching and sore.  She landed on her Left side but the pain is on the Right. She believes it is just muscular but wants to make sure.  Pain is aching and sore, 8/10, worse with certain movements.    Past Medical History:  Diagnosis Date  . ADHD (attention deficit hyperactivity disorder)   . Anxiety in acute stress reaction    delays sleep  . Crohn's disease (HCC)    in remission today  . Insomnia    thinking too much inability to relax  . Obsessive behaviors    cannot relax  . PCOS (polycystic ovarian syndrome)    HIRSUITISM -unresponsive to aldosterone; metformin-diarrhea  . Stomach pain    endoscopy performed; entiology-presumed Crohn's Ds    Patient Active Problem List   Diagnosis Date Noted  . OCD (obsessive compulsive disorder) 01/25/2012  . Generalized anxiety disorder 11/20/2011  . ADHD (attention deficit hyperactivity disorder), inattentive type 10/12/2011  . Obsessive behaviors 10/12/2011  . Insomnia 10/12/2011    History reviewed. No pertinent surgical history.  OB History   No obstetric history on file.      Home Medications    Prior to Admission medications   Medication Sig Start Date End Date Taking? Authorizing Provider  lisdexamfetamine (VYVANSE) 20 MG capsule Take 1 capsule (20 mg total) by mouth every morning. Patient taking differently: Take 50 mg by mouth every morning.  01/11/12 08/15/20 Yes Mickeal Skinner, MD  norgestimate-ethinyl estradiol (SPRINTEC 28) 0.25-35 MG-MCG tablet Take 1 tablet by  mouth daily.   Yes [provider]  buPROPion (WELLBUTRIN SR) 100 MG 12 hr tablet Take 1 tablet (100 mg total) by mouth 2 (two) times daily. Take one tablet at 8 AM and one tablet 3 PM. 02/22/12 02/21/13  Puthuvel, Iven Finn, MD  cyclobenzaprine (FLEXERIL) 5 MG tablet Take 1-2 tablets (5-10 mg total) by mouth 2 (two) times daily as needed for muscle spasms. 08/15/20   Lurene Shadow, PA-C  etonogestrel-ethinyl estradiol (NUVARING) 0.12-0.015 MG/24HR vaginal ring Place 1 each vaginally every 28 (twenty-eight) days. Insert vaginally and leave in place for 3 consecutive weeks, then remove for 1 week.    Jolayne Haines, MD  lisdexamfetamine (VYVANSE) 20 MG capsule Take 1 capsule (20 mg total) by mouth every morning. 10/12/11 11/11/11  Mickeal Skinner, MD  lisdexamfetamine (VYVANSE) 20 MG capsule Take 1 capsule (20 mg total) by mouth every morning. 12/11/11 01/10/12  Mickeal Skinner, MD  metFORMIN (GLUCOPHAGE) 500 MG tablet Take 500 mg by mouth daily. Rx causes diarrhea   Pt takes it only PRN for PCOS but aggravates Chron's SDs.  04/10/09   [provider]  norethindrone-ethinyl estradiol (JUNEL FE,GILDESS FE,LOESTRIN FE) 1-20 MG-MCG tablet Take 1 tablet by mouth daily.   10/11/08   [provider]  zolpidem (AMBIEN) 10 MG tablet Take 1 tablet (10 mg total) by mouth at bedtime as needed for sleep. 10/12/11 11/11/11  Bogard,  Jamesetta SoPhyllis, MD  zolpidem (AMBIEN) 10 MG tablet Take 1 tablet (10 mg total) by mouth at bedtime as needed for sleep. 12/11/11 01/10/12  Mickeal SkinnerBogard, Phyllis, MD  zolpidem (AMBIEN) 10 MG tablet Take 1 tablet (10 mg total) by mouth at bedtime as needed for sleep. 01/11/12 02/10/12  Mickeal SkinnerBogard, Phyllis, MD    Family History Family History  Problem Relation Age of Onset  . Depression Mother   . Depression Father   . Alcohol abuse Maternal Grandfather   . Alcohol abuse Maternal Grandmother   . Alcohol abuse Paternal Grandfather     Social History Social History   Tobacco Use  . Smoking  status: Never Smoker  . Smokeless tobacco: Never Used  Substance Use Topics  . Alcohol use: Yes    Alcohol/week: 2.0 standard drinks    Types: 2 Standard drinks or equivalent per week    Comment: 2 glasses of wine 2-3 times per week  . Drug use: Not Currently    Frequency: 3.0 times per week    Types: Marijuana    Comment: reports it helps her to go to sleep; she has seldom used since she was placed on Ambien. about three weeks ago.     Allergies   Patient has no known allergies.   Review of Systems Review of Systems  Musculoskeletal: Positive for back pain and myalgias.  Neurological: Negative for weakness and numbness.     Physical Exam Triage Vital Signs ED Triage Vitals  Enc Vitals Group     BP 08/15/20 1036 140/88     Pulse Rate 08/15/20 1036 84     Resp 08/15/20 1036 16     Temp 08/15/20 1036 98.3 F (36.8 C)     Temp Source 08/15/20 1036 Oral     SpO2 08/15/20 1036 98 %     Weight --      Height --      Head Circumference --      Peak Flow --      Pain Score 08/15/20 1033 8     Pain Loc --      Pain Edu? --      Excl. in GC? --    No data found.  Updated Vital Signs BP 140/88 (BP Location: Right Arm)   Pulse 84   Temp 98.3 F (36.8 C) (Oral)   Resp 16   LMP 08/05/2020   SpO2 98%   Visual Acuity Right Eye Distance:   Left Eye Distance:   Bilateral Distance:    Right Eye Near:   Left Eye Near:    Bilateral Near:     Physical Exam Vitals and nursing note reviewed.  Constitutional:      General: She is not in acute distress.    Appearance: Normal appearance. She is well-developed. She is not ill-appearing, toxic-appearing or diaphoretic.  HENT:     Head: Normocephalic and atraumatic.  Cardiovascular:     Rate and Rhythm: Normal rate and regular rhythm.  Pulmonary:     Effort: Pulmonary effort is normal. No respiratory distress.     Breath sounds: Normal breath sounds.  Chest:     Chest wall: No tenderness.  Musculoskeletal:         General: Tenderness present. No swelling. Normal range of motion.     Cervical back: Normal range of motion and neck supple. Tenderness present. No rigidity. Muscular tenderness (minimal right side) present. No spinous process tenderness.     Comments: Tenderness along upper and mid  thoracic paraspinal muscles. Full ROM upper and lower extremities with 5/5 strength.   Skin:    General: Skin is warm and dry.     Capillary Refill: Capillary refill takes less than 2 seconds.     Findings: No bruising.  Neurological:     Mental Status: She is alert and oriented to person, place, and time.     Sensory: No sensory deficit.  Psychiatric:        Behavior: Behavior normal.      UC Treatments / Results  Labs (all labs ordered are listed, but only abnormal results are displayed) Labs Reviewed - No data to display  EKG   Radiology Narrative & Impression  CLINICAL DATA:  Fall from ladder on Saturday, pain  EXAM: THORACIC SPINE 2 VIEWS  COMPARISON:  None.  FINDINGS: No fracture or dislocation of the thoracic spine. There is a focal dextroscoliosis of the thoracic spine, apex T6. Disc spaces and vertebral body heights are preserved. Partially imaged chest is unremarkable.  IMPRESSION: No fracture or dislocation of the thoracic spine. There is a focal dextroscoliosis of the thoracic spine, apex T6. Disc spaces and vertebral body heights are preserved.   Electronically Signed   By: Lauralyn Primes M.D.   On: 08/15/2020 11:48   Narrative & Impression  CLINICAL DATA:  Fall from ladder on Saturday right-sided neck and back pain  EXAM: CERVICAL SPINE - COMPLETE 4+ VIEW  COMPARISON:  None.  FINDINGS: Straightening and reversal of the normal cervical lordosis. Disc spaces and vertebral body heights are preserved. No bony neural foraminal stenosis. Partially included skull base, cervical soft tissues, and upper chest are unremarkable. No fracture or static subluxation of  the cervical spine.  IMPRESSION: 1. No fracture or static subluxation of the cervical spine. Please note that plain radiographs are significantly insensitive for cervical fracture in the setting of trauma. Consider CT to further evaluate if there is high clinical suspicion of fracture based on mechanism in clinical signs and symptoms.  2. Straightening and reversal of the normal cervical lordosis, which may be positional and/or developmental in the absence of significant disc degenerative disease.  3. Disc spaces and vertebral body heights are preserved. No bony neural foraminal stenosis.   Electronically Signed   By: Lauralyn Primes M.D.   On: 08/15/2020 11:44      Procedures Procedures (including critical care time)  Medications Ordered in UC Medications - No data to display  Initial Impression / Assessment and Plan / UC Course  I have reviewed the triage vital signs and the nursing notes.  Pertinent labs & imaging results that were available during my care of the patient were reviewed by me and considered in my medical decision making (see chart for details).     Discussed imaging with pt  Encouraged conservative tx at this time F/u with PCP or Sports Medicine  AVS given  Final Clinical Impressions(s) / UC Diagnoses   Final diagnoses:  Neck pain on right side  Upper back pain on right side  Acute right-sided thoracic back pain  Fall from ladder, initial encounter     Discharge Instructions     You may take 500mg  acetaminophen every 4-6 hours or in combination with ibuprofen 400-600mg  every 6-8 hours as needed for pain and inflammation.  Flexeril (cyclobenzaprine) is a muscle relaxer and may cause drowsiness. Do not drink alcohol, drive, or operate heavy machinery while taking.  Call to schedule a follow up appointment with primary care or sports  medicine later this week or next week if not improving, especially if you develop new or worsening  symptoms.     ED Prescriptions    Medication Sig Dispense Auth. Provider   cyclobenzaprine (FLEXERIL) 5 MG tablet Take 1-2 tablets (5-10 mg total) by mouth 2 (two) times daily as needed for muscle spasms. 30 tablet Lurene Shadow, [place]     PDMP not reviewed this encounter.   Lurene Shadow, [place] 08/18/20 [phone]

## 2020-08-15 NOTE — ED Triage Notes (Signed)
Patient presents to Urgent Care with complaints of right upper/middle back pain, shoulder pain, and neck pain since she fell off a ladder, thinks she was about 6 feet up. Patient reports she took ibuprofen yesterday and that helped. Pt denies LOC or head trauma.  Pain is on the right but she landed on her left side.

## 2020-08-15 NOTE — Discharge Instructions (Signed)
You may take 500mg  acetaminophen every 4-6 hours or in combination with ibuprofen 400-600mg  every 6-8 hours as needed for pain and inflammation.  Flexeril (cyclobenzaprine) is a muscle relaxer and may cause drowsiness. Do not drink alcohol, drive, or operate heavy machinery while taking.  Call to schedule a follow up appointment with primary care or sports medicine later this week or next week if not improving, especially if you develop new or worsening symptoms.

## 2022-04-09 IMAGING — DX DG CERVICAL SPINE COMPLETE 4+V
6 series · 6 of 6 positions shown · non-contrast
Comparison: None.

CLINICAL DATA: Fall from ladder on [REDACTED] right-sided neck and
back pain

EXAM:
CERVICAL SPINE - COMPLETE 4+ VIEW

[c-spine lat]
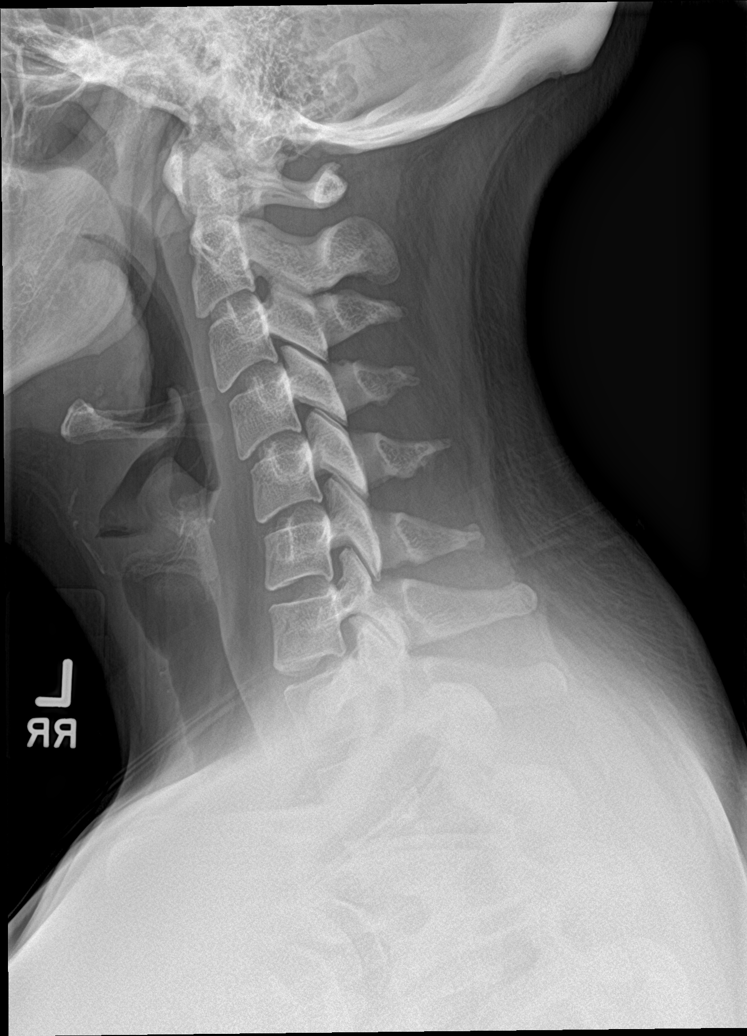

[c-spine obl (1 of 2)]
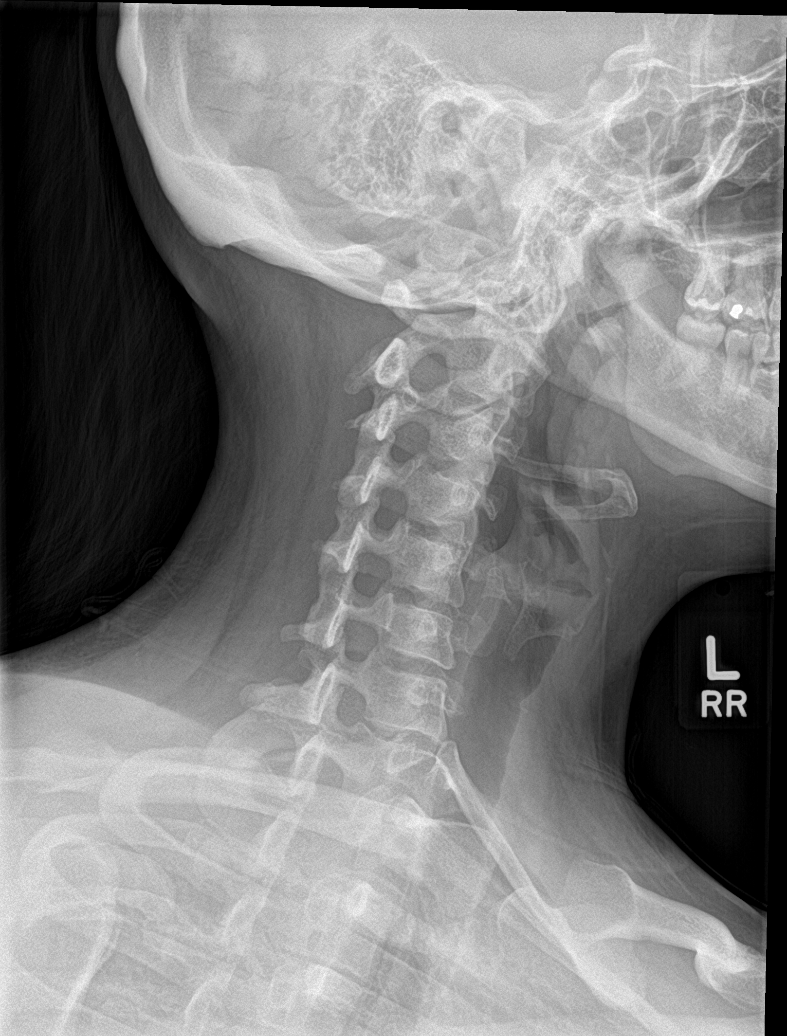

[c-spine obl (2 of 2)]
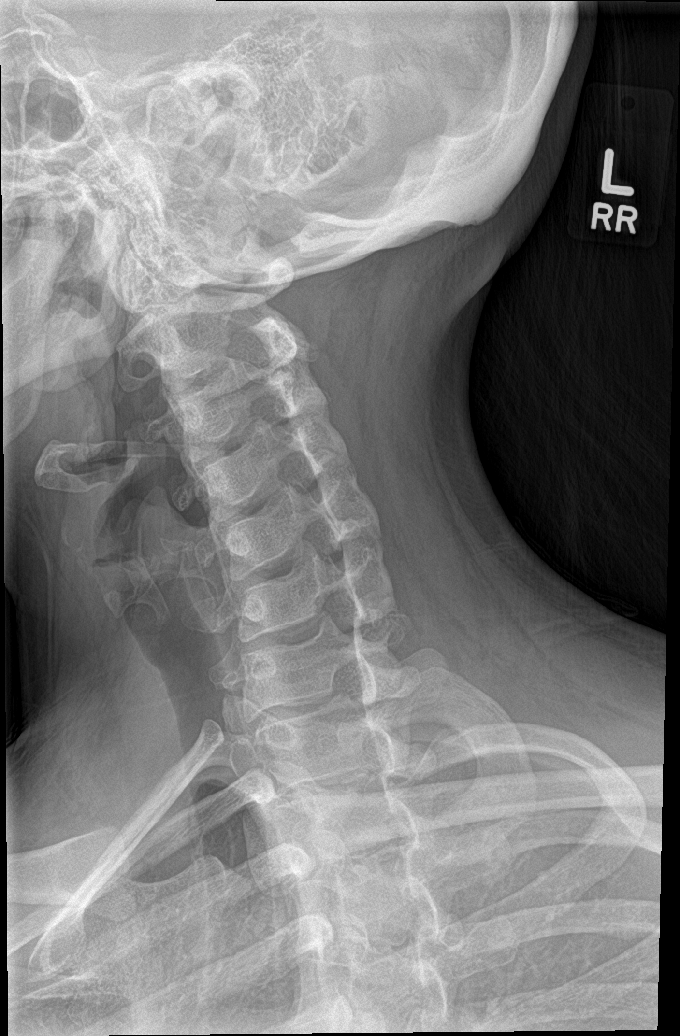

[c-spine ap]
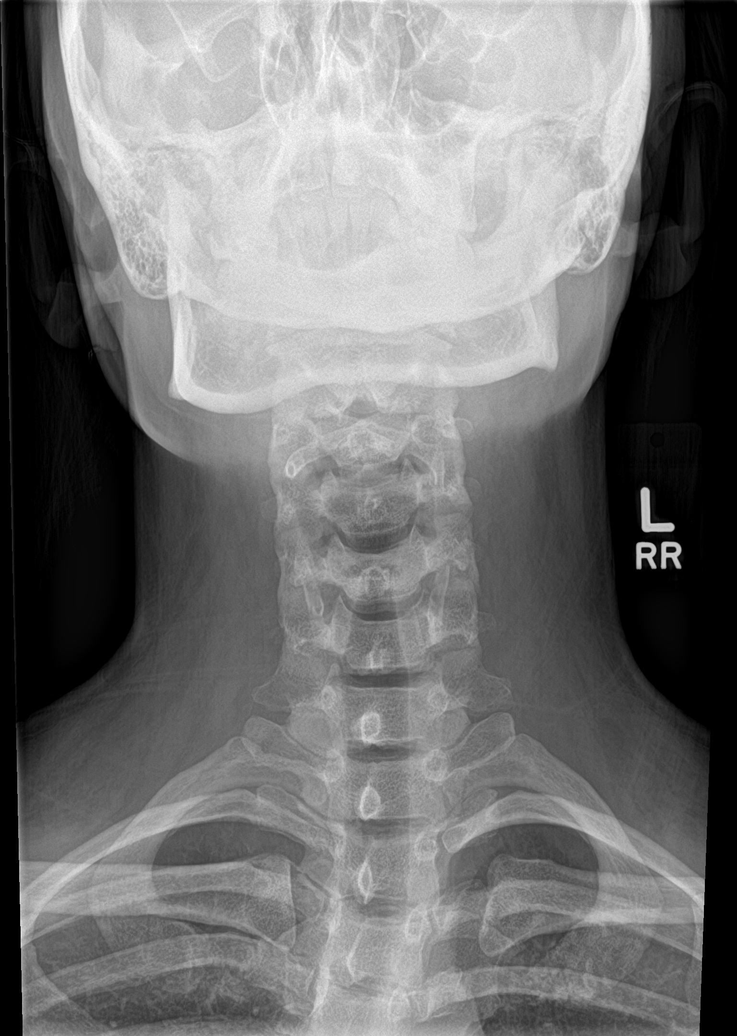

[c-spine open mouth]
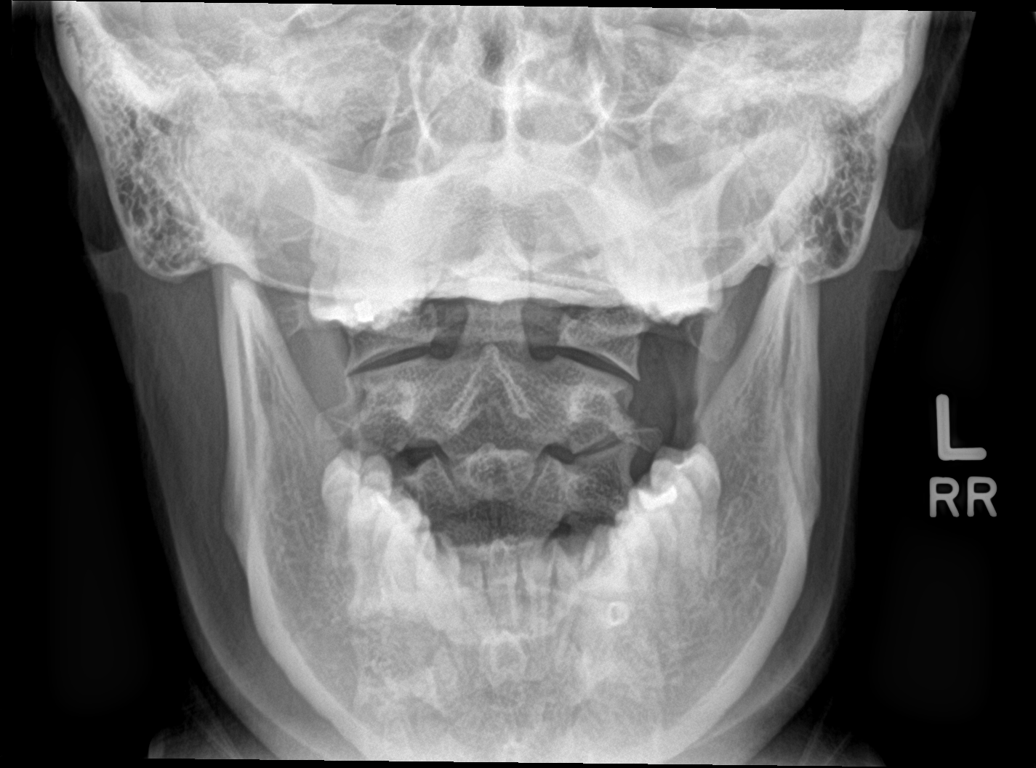

[[person_name]]
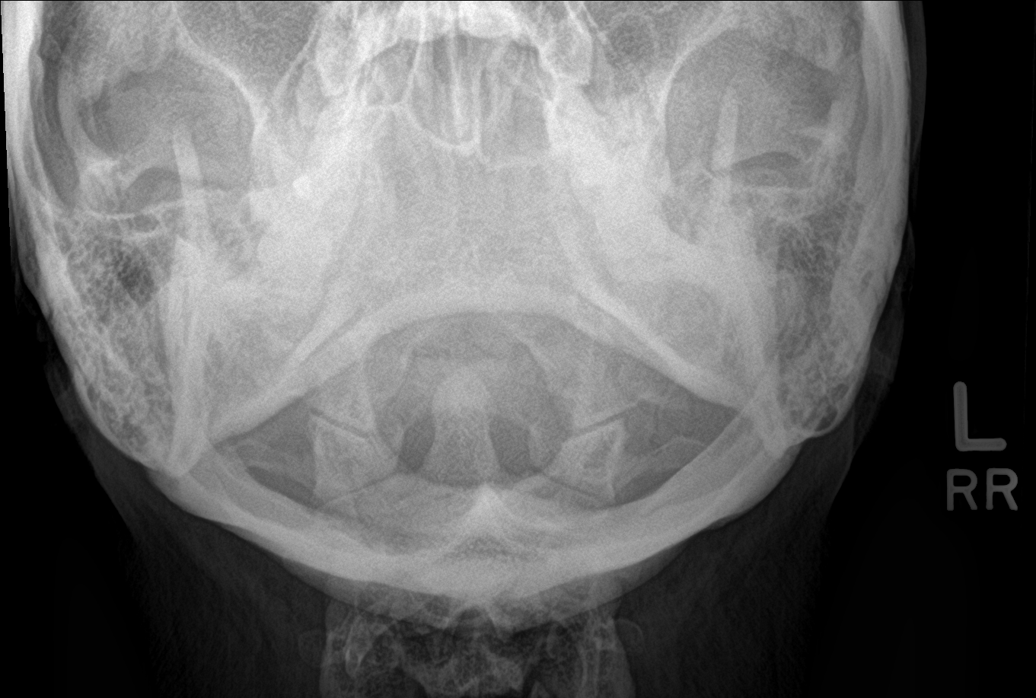

[6 of 6 positions shown; findings below may reference images not displayed]

FINDINGS: Straightening and reversal of the normal cervical lordosis. Disc
spaces and vertebral body heights are preserved. No bony neural
foraminal stenosis. Partially included skull base, cervical soft
tissues, and upper chest are unremarkable. No fracture or static
subluxation of the cervical spine.
IMPRESSION: 1. No fracture or static subluxation of the cervical spine. Please
note that plain radiographs are significantly insensitive for
cervical fracture in the setting of trauma. Consider CT to further
evaluate if there is high clinical suspicion of fracture based on
mechanism in clinical signs and symptoms.

2. Straightening and reversal of the normal cervical lordosis, which
may be positional and/or developmental in the absence of significant
disc degenerative disease.

3. Disc spaces and vertebral body heights are preserved. No bony
neural foraminal stenosis.
